# Patient Record
Sex: Male | Born: 1960 | Race: Black or African American | Hispanic: No | Marital: Single | Smoking: Current every day smoker
Health system: Southern US, Community
[De-identification: ages and names within clinical notes are randomized; demographics above are authoritative.]

---

## 1985-07-06 HISTORY — PX: KNEE SURGERY: SHX244

## 2004-07-22 ENCOUNTER — Inpatient Hospital Stay: Payer: Self-pay | Admitting: Internal Medicine

## 2004-09-17 ENCOUNTER — Emergency Department: Payer: Self-pay | Admitting: Emergency Medicine

## 2004-12-12 ENCOUNTER — Inpatient Hospital Stay: Payer: Self-pay | Admitting: Internal Medicine

## 2004-12-12 ENCOUNTER — Other Ambulatory Visit: Payer: Self-pay

## 2005-02-16 ENCOUNTER — Emergency Department: Payer: Self-pay | Admitting: Unknown Physician Specialty

## 2006-01-02 IMAGING — CR DG ABDOMEN 3V
1 series · 6 of 6 positions shown · non-contrast
Comparison: none

REASON FOR EXAM: abd pain  rm 6
COMMENTS:

[Series 1: view not recorded · 0.17mm/px · 6 of 6 slices shown]
[im 1/6]
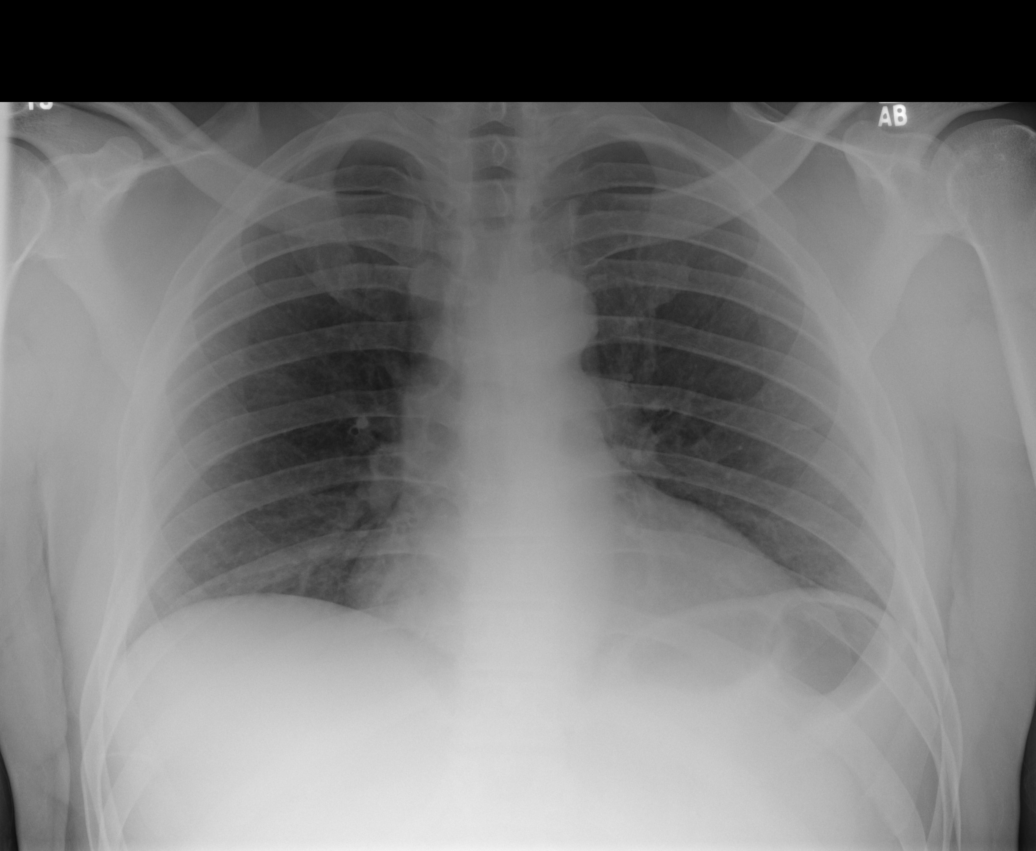
[im 2/6]
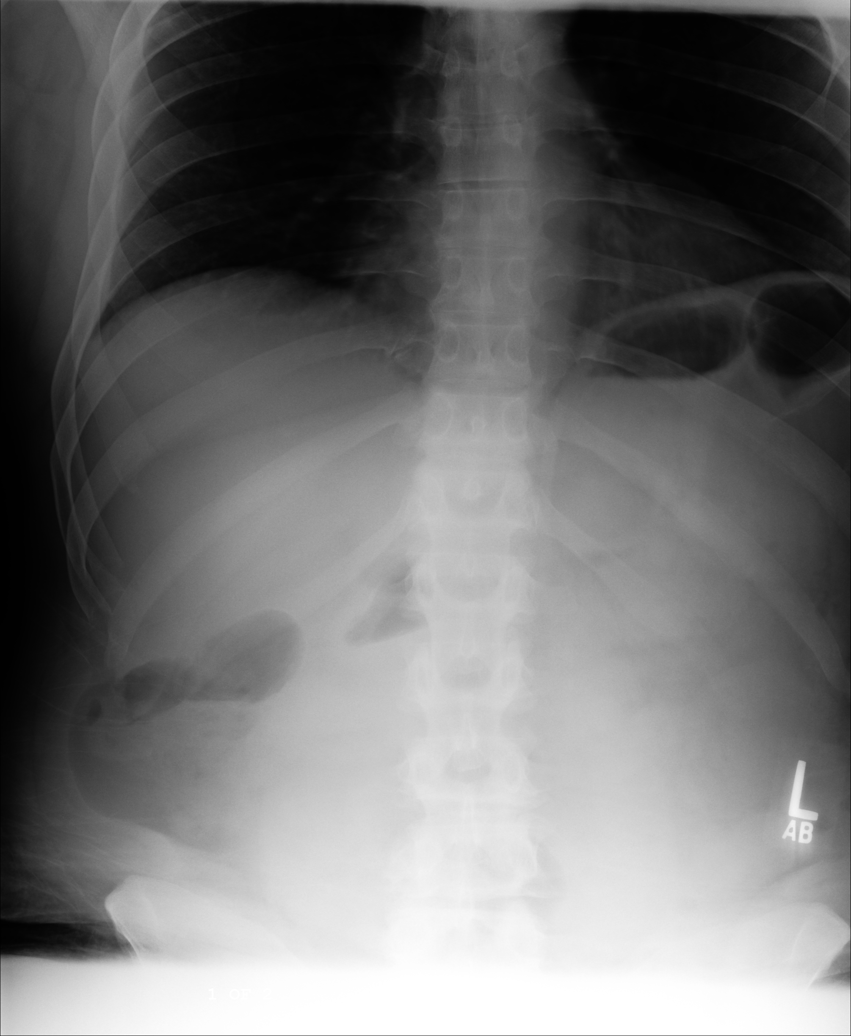
[im 3/6]
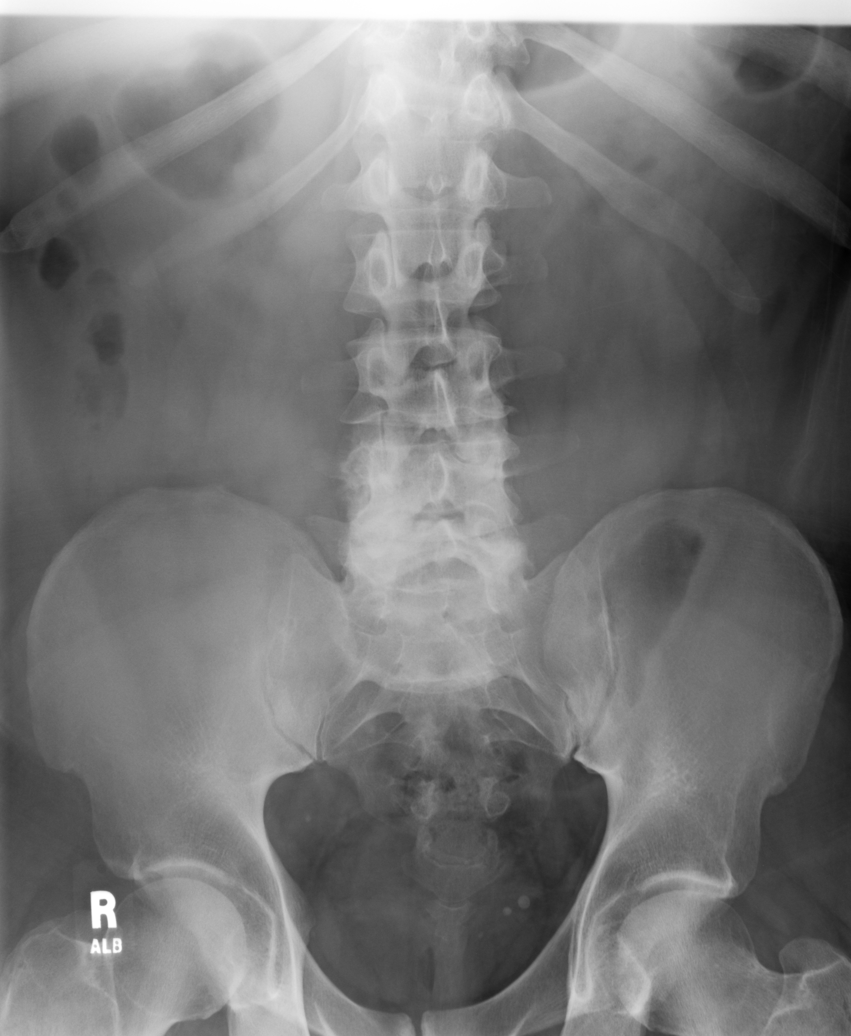
[im 4/6]
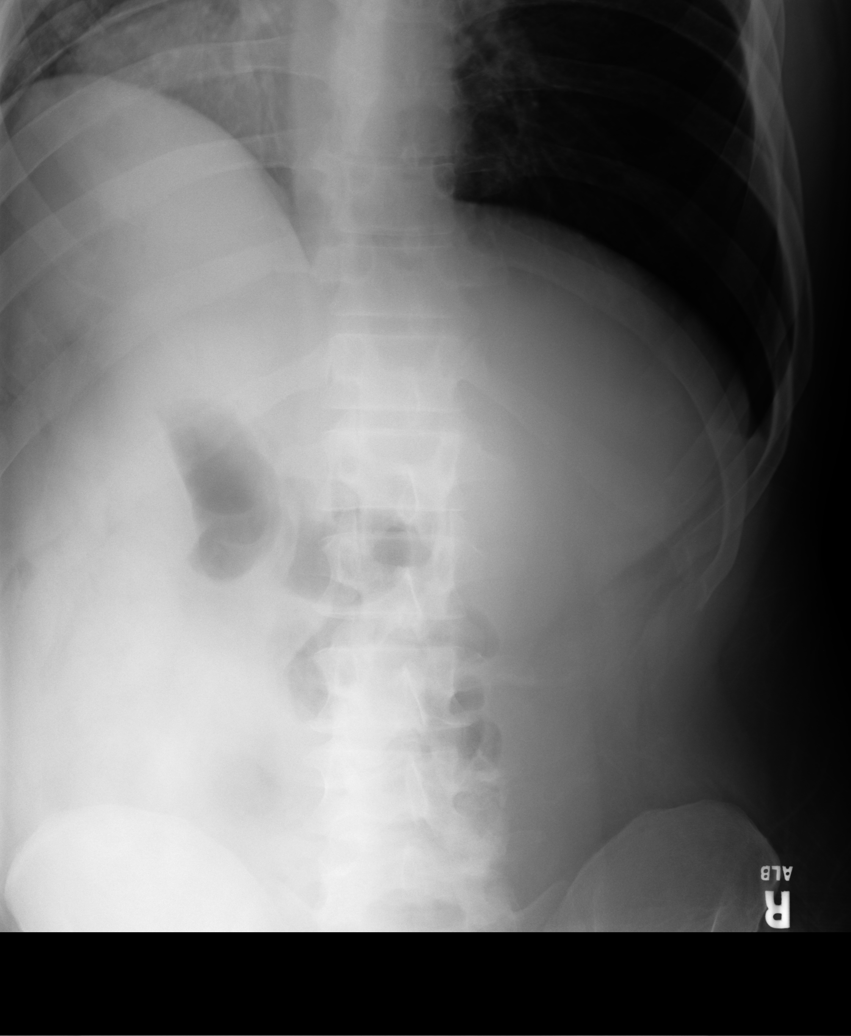
[im 5/6]
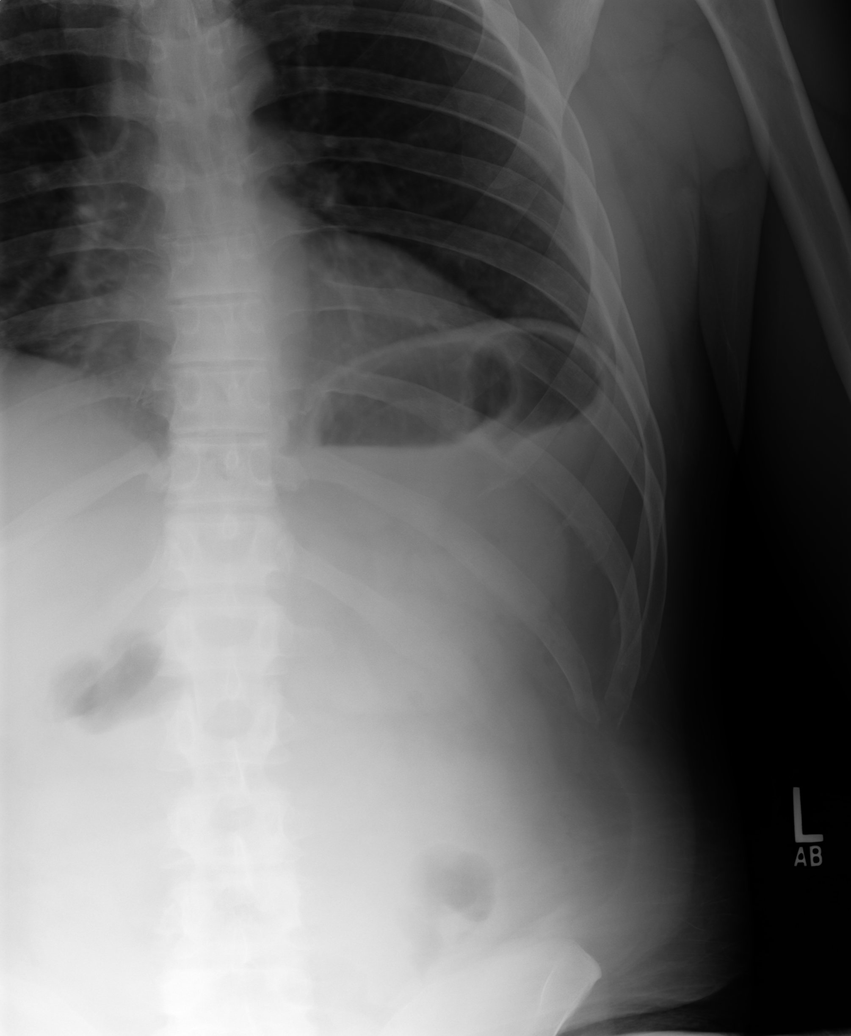
[im 6/6]
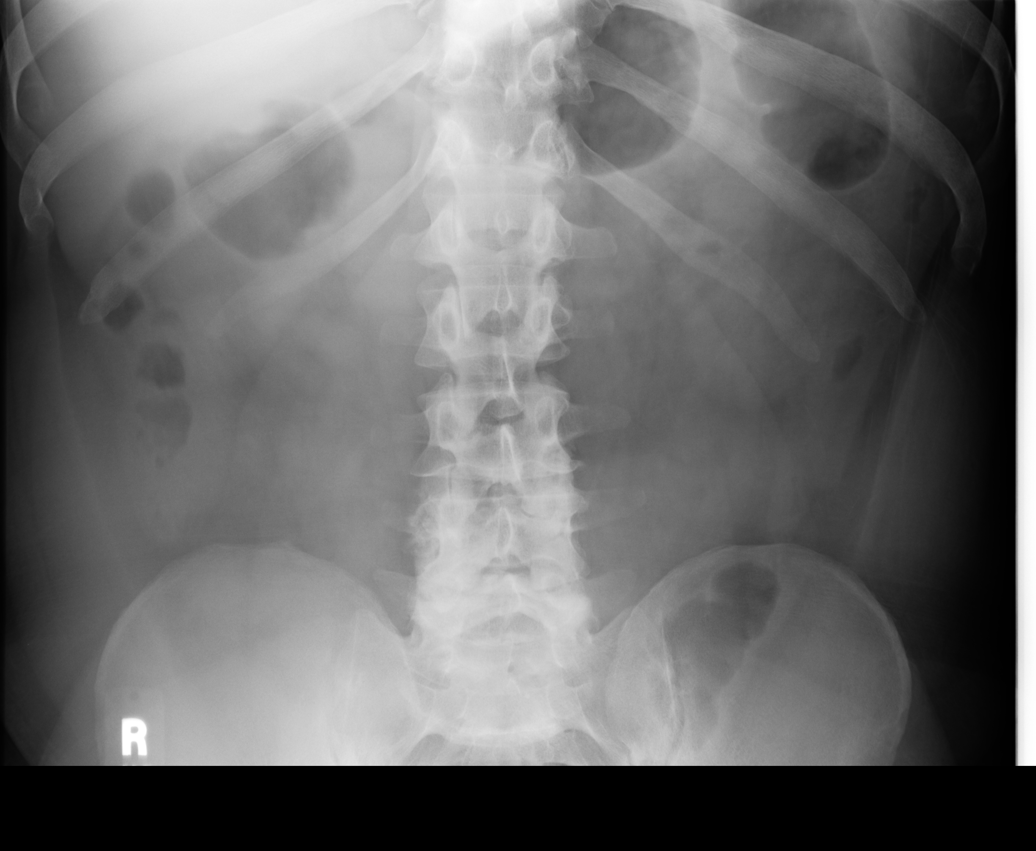

[6 of 6 positions shown; findings below may reference images not displayed]

PROCEDURE:     DXR - DXR ABDOMEN 3-WAY (INCL PA CXR)  - December 12, 2004  [DATE]

RESULT:     No subdiaphragmatic free air is seen. The bowel gas pattern
shows no specific abnormalities.  Gas is visualized in both large and small
bowel.  No evidence of bowel obstruction is seen.  No abnormal
intraabdominal calcifications are noted.  No acute bony abnormalities are
seen.  PA views of the chest show the lung fields to be clear.  The heart is
upper limits of normal in size or mildly enlarged.   No acute bony
abnormalities are seen.
IMPRESSION: 1.     No specific abnormalities are identified.
2.     PA view of the chest shows the lung fields to be clear.

## 2015-04-29 ENCOUNTER — Encounter: Payer: Self-pay | Admitting: *Deleted

## 2015-04-29 ENCOUNTER — Emergency Department
Admission: EM | Admit: 2015-04-29 | Discharge: 2015-04-29 | Disposition: A | Discharge status: Home / Self Care | Payer: No Typology Code available for payment source | Attending: Emergency Medicine | Admitting: Emergency Medicine

## 2015-04-29 DIAGNOSIS — S199XXA Unspecified injury of neck, initial encounter: Secondary | ICD-10-CM | POA: Insufficient documentation

## 2015-04-29 DIAGNOSIS — Y92410 Unspecified street and highway as the place of occurrence of the external cause: Secondary | ICD-10-CM | POA: Insufficient documentation

## 2015-04-29 DIAGNOSIS — Y998 Other external cause status: Secondary | ICD-10-CM | POA: Insufficient documentation

## 2015-04-29 DIAGNOSIS — Y9389 Activity, other specified: Secondary | ICD-10-CM | POA: Diagnosis not present

## 2015-04-29 DIAGNOSIS — S39012A Strain of muscle, fascia and tendon of lower back, initial encounter: Secondary | ICD-10-CM | POA: Insufficient documentation

## 2015-04-29 DIAGNOSIS — S3992XA Unspecified injury of lower back, initial encounter: Secondary | ICD-10-CM | POA: Diagnosis present

## 2015-04-29 DIAGNOSIS — S0990XA Unspecified injury of head, initial encounter: Secondary | ICD-10-CM | POA: Insufficient documentation

## 2015-04-29 DIAGNOSIS — Z720 Tobacco use: Secondary | ICD-10-CM | POA: Diagnosis not present

## 2015-04-29 MED ORDER — ACETAMINOPHEN 500 MG PO TABS
1000.0000 mg | ORAL_TABLET | Freq: Once | ORAL | Status: AC
  Administered 2015-04-29: 1000 mg via ORAL
  Filled 2015-04-29: qty 2

## 2015-04-29 MED ORDER — NAPROXEN 500 MG PO TBEC: 500.0000 mg | DELAYED_RELEASE_TABLET | Freq: Two times a day (BID) | ORAL | Status: DC

## 2015-04-29 MED ORDER — CYCLOBENZAPRINE HCL 5 MG PO TABS: 5.0000 mg | ORAL_TABLET | Freq: Three times a day (TID) | ORAL | Status: DC | PRN
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
## 2017-03-29 NOTE — Progress Notes (Deleted)
03/30/2017 10:25 AM   Patrick Rosario 24-Apr-1961 [id number]  Referring provider: Sherron Monday, MD [address] [place], [place] [address]  No chief complaint on file.   HPI: Patient is a 56 year old African American male who is referred by Dr. Ellsworth Lennox for a penile lesion. Location Severity Quality Context Timing Duration Modifying Factors Associated signs/symptoms            OR Status of 3 chronic or inactive problems   BPH WITH LUTS  (prostate and/or bladder) His IPSS score today is ***, which is *** lower urinary tract symptomatology. He is *** with his quality life due to his urinary symptoms. His PVR is *** mL.  His previous IPSS score was ***.  His previous PVR is *** mL.    His major complaint today ***.  He has had these symptoms for *** years.  He denies any dysuria, hematuria or suprapubic pain.   He currently taking ***.  His has had ***.  Previous PSA's:     He also denies any recent fevers, chills, nausea or vomiting.  He has a family history of PCa, with ***.   He does not have a family history of PCa.***    Score:  1-7 Mild 8-19 Moderate 20-35 Severe   Reviewed referral notes - HSV was negative  PMH: No past medical history on file.  Surgical History: No past surgical history on file.  Home Medications:  Allergies as of 03/30/2017   No Known Allergies     Medication List       Accurate as of 03/29/17 10:25 AM. Always use your most recent med list.          cyclobenzaprine 5 MG tablet Commonly known as:  FLEXERIL Take 1 tablet (5 mg total) by mouth every 8 (eight) hours as needed for muscle spasms.   naproxen 500 MG EC tablet Commonly known as:  EC NAPROSYN Take 1 tablet (500 mg total) by mouth 2 (two) times daily with a meal.       Allergies: No Known Allergies  Family History: No family history on file.  Social History:  reports that he has been smoking.  He does not have any smokeless tobacco  history on file. He reports that he drinks alcohol. He reports that he does not use drugs.  ROS:                                        Physical Exam: There were no vitals taken for this visit.  Constitutional: Well nourished. Alert and oriented, No acute distress. HEENT: [place] AT, moist mucus membranes. Trachea midline, no masses. Cardiovascular: No clubbing, cyanosis, or edema. Respiratory: Normal respiratory effort, no increased work of breathing. GI: Abdomen is soft, non tender, non distended, no abdominal masses. Liver and spleen not palpable.  No hernias appreciated.  Stool sample for occult testing is not indicated.   GU: No CVA tenderness.  No bladder fullness or masses.  Patient with circumcised/uncircumcised phallus. ***Foreskin easily retracted***  Urethral meatus is patent.  No penile discharge. No penile lesions or rashes. Scrotum without lesions, cysts, rashes and/or edema.  Testicles are located scrotally bilaterally. No masses are appreciated in the testicles. Left and right epididymis are normal. Rectal: Patient with  normal sphincter tone. Anus and perineum without scarring or rashes. No rectal masses are appreciated. Prostate is approximately ***  grams, *** nodules are appreciated. Seminal vesicles are normal. Skin: No rashes, bruises or suspicious lesions. Lymph: No cervical or inguinal adenopathy. Neurologic: Grossly intact, no focal deficits, moving all 4 extremities. Psychiatric: Normal mood and affect.  Laboratory Data:  Urinalysis ***  I have reviewed the labs.   Pertinent Imaging: *** I have independently reviewed the films.    Assessment & Plan:  ***  1. Penile lesion  2. BPH with LUTS  - IPSS score is ***, it is stable/improving/worsening  - Continue conservative management, avoiding bladder irritants and timed voiding's  - most bothersome symptoms is/are ***  - Initiate alpha-blocker (***), discussed side effects ***  -  Initiate 5 alpha reductase inhibitor (***), discussed side effects ***  - Continue tamsulosin 0.4 mg daily, alfuzosin 10 mg daily, Rapaflo 8 mg daily, terazosin, doxazosin, Cialis 5 mg daily and finasteride 5 mg daily, dutasteride 0.5 mg daily***:refills given  - Cannot tolerate medication or medication failure, schedule cystoscopy ***  - RTC in *** months for IPSS, PSA, PVR and exam      No Follow-up on file.  These notes generated with voice recognition software. I apologize for typographical errors.  [name], PA-C  [facility] [address] [place], [place] [address] [phone]

## 2017-03-30 ENCOUNTER — Ambulatory Visit: Payer: Self-pay | Admitting: Urology

## 2017-04-05 NOTE — Progress Notes (Deleted)
04/07/2017 9:21 PM   Patrick Rosario [birth year] [id number]  Referring provider: Sherron Monday, MD [address] [place], [place] [address]  No chief complaint on file.   HPI: Patient is a 56 year old African American male who is referred by Dr. Ellsworth Lennox for a penile lesion. Location Severity Quality Context Timing Duration Modifying Factors Associated signs/symptoms            OR Status of 3 chronic or inactive problems   BPH WITH LUTS  (prostate and/or bladder) His IPSS score today is ***, which is *** lower urinary tract symptomatology. He is *** with his quality life due to his urinary symptoms. His PVR is *** mL.  His previous IPSS score was ***.  His previous PVR is *** mL.    His major complaint today ***.  He has had these symptoms for *** years.  He denies any dysuria, hematuria or suprapubic pain.   He currently taking ***.  His has had ***.  Previous PSA's:     He also denies any recent fevers, chills, nausea or vomiting.  He has a family history of PCa, with ***.   He does not have a family history of PCa.***    Score:  1-7 Mild 8-19 Moderate 20-35 Severe   Reviewed referral notes - HSV was negative  PMH: No past medical history on file.  Surgical History: No past surgical history on file.  Home Medications:  Allergies as of 04/07/2017   No Known Allergies     Medication List       Accurate as of 04/05/17  9:21 PM. Always use your most recent med list.          cyclobenzaprine 5 MG tablet Commonly known as:  FLEXERIL Take 1 tablet (5 mg total) by mouth every 8 (eight) hours as needed for muscle spasms.   naproxen 500 MG EC tablet Commonly known as:  EC NAPROSYN Take 1 tablet (500 mg total) by mouth 2 (two) times daily with a meal.       Allergies: No Known Allergies  Family History: No family history on file.  Social History:  reports that he has been smoking.  He does not have any smokeless tobacco history  on file. He reports that he drinks alcohol. He reports that he does not use drugs.  ROS:                                        Physical Exam: There were no vitals taken for this visit.  Constitutional: Well nourished. Alert and oriented, No acute distress. HEENT: [place] AT, moist mucus membranes. Trachea midline, no masses. Cardiovascular: No clubbing, cyanosis, or edema. Respiratory: Normal respiratory effort, no increased work of breathing. GI: Abdomen is soft, non tender, non distended, no abdominal masses. Liver and spleen not palpable.  No hernias appreciated.  Stool sample for occult testing is not indicated.   GU: No CVA tenderness.  No bladder fullness or masses.  Patient with circumcised/uncircumcised phallus. ***Foreskin easily retracted***  Urethral meatus is patent.  No penile discharge. No penile lesions or rashes. Scrotum without lesions, cysts, rashes and/or edema.  Testicles are located scrotally bilaterally. No masses are appreciated in the testicles. Left and right epididymis are normal. Rectal: Patient with  normal sphincter tone. Anus and perineum without scarring or rashes. No rectal masses are appreciated. Prostate is approximately ***  grams, *** nodules are appreciated. Seminal vesicles are normal. Skin: No rashes, bruises or suspicious lesions. Lymph: No cervical or inguinal adenopathy. Neurologic: Grossly intact, no focal deficits, moving all 4 extremities. Psychiatric: Normal mood and affect.  Laboratory Data:  Urinalysis ***  I have reviewed the labs.   Pertinent Imaging: *** I have independently reviewed the films.    Assessment & Plan:  ***  1. Penile lesion  2. BPH with LUTS  - IPSS score is ***, it is stable/improving/worsening  - Continue conservative management, avoiding bladder irritants and timed voiding's  - most bothersome symptoms is/are ***  - Initiate alpha-blocker (***), discussed side effects ***  - Initiate 5  alpha reductase inhibitor (***), discussed side effects ***  - Continue tamsulosin 0.4 mg daily, alfuzosin 10 mg daily, Rapaflo 8 mg daily, terazosin, doxazosin, Cialis 5 mg daily and finasteride 5 mg daily, dutasteride 0.5 mg daily***:refills given  - Cannot tolerate medication or medication failure, schedule cystoscopy ***  - RTC in *** months for IPSS, PSA, PVR and exam      No Follow-up on file.  These notes generated with voice recognition software. I apologize for typographical errors.  [name], PA-C  [facility] [address], [address] [place], [place] [address] [phone]

## 2017-04-07 ENCOUNTER — Ambulatory Visit: Payer: Self-pay | Admitting: Urology

## 2017-04-12 NOTE — Progress Notes (Deleted)
04/13/2017 10:32 PM   Patrick Rosario September 01, 1960 [id number]  Referring provider: Sherron Monday, MD [address] [place], [place] [address]  No chief complaint on file.   HPI: Patient is a 56 year old African American male who is referred by Dr. Ellsworth Lennox for a penile lesion. Location Severity Quality Context Timing Duration Modifying Factors Associated signs/symptoms            OR Status of 3 chronic or inactive problems   BPH WITH LUTS  (prostate and/or bladder) His IPSS score today is ***, which is *** lower urinary tract symptomatology. He is *** with his quality life due to his urinary symptoms. His PVR is *** mL.  His previous IPSS score was ***.  His previous PVR is *** mL.    His major complaint today ***.  He has had these symptoms for *** years.  He denies any dysuria, hematuria or suprapubic pain.   He currently taking ***.  His has had ***.  Previous PSA's:     He also denies any recent fevers, chills, nausea or vomiting.  He has a family history of PCa, with ***.   He does not have a family history of PCa.***    Score:  1-7 Mild 8-19 Moderate 20-35 Severe   Reviewed referral notes - HSV was negative  PMH: No past medical history on file.  Surgical History: No past surgical history on file.  Home Medications:  Allergies as of 04/13/2017   No Known Allergies     Medication List       Accurate as of 04/12/17 10:32 PM. Always use your most recent med list.          cyclobenzaprine 5 MG tablet Commonly known as:  FLEXERIL Take 1 tablet (5 mg total) by mouth every 8 (eight) hours as needed for muscle spasms.   naproxen 500 MG EC tablet Commonly known as:  EC NAPROSYN Take 1 tablet (500 mg total) by mouth 2 (two) times daily with a meal.       Allergies: No Known Allergies  Family History: No family history on file.  Social History:  reports that he has been smoking.  He does not have any smokeless tobacco  history on file. He reports that he drinks alcohol. He reports that he does not use drugs.  ROS:                                        Physical Exam: There were no vitals taken for this visit.  Constitutional: Well nourished. Alert and oriented, No acute distress. HEENT: [place] AT, moist mucus membranes. Trachea midline, no masses. Cardiovascular: No clubbing, cyanosis, or edema. Respiratory: Normal respiratory effort, no increased work of breathing. GI: Abdomen is soft, non tender, non distended, no abdominal masses. Liver and spleen not palpable.  No hernias appreciated.  Stool sample for occult testing is not indicated.   GU: No CVA tenderness.  No bladder fullness or masses.  Patient with circumcised/uncircumcised phallus. ***Foreskin easily retracted***  Urethral meatus is patent.  No penile discharge. No penile lesions or rashes. Scrotum without lesions, cysts, rashes and/or edema.  Testicles are located scrotally bilaterally. No masses are appreciated in the testicles. Left and right epididymis are normal. Rectal: Patient with  normal sphincter tone. Anus and perineum without scarring or rashes. No rectal masses are appreciated. Prostate is approximately ***  grams, *** nodules are appreciated. Seminal vesicles are normal. Skin: No rashes, bruises or suspicious lesions. Lymph: No cervical or inguinal adenopathy. Neurologic: Grossly intact, no focal deficits, moving all 4 extremities. Psychiatric: Normal mood and affect.  Laboratory Data:  Urinalysis ***  I have reviewed the labs.   Pertinent Imaging: *** I have independently reviewed the films.    Assessment & Plan:  ***  1. Penile lesion  2. BPH with LUTS  - IPSS score is ***, it is stable/improving/worsening  - Continue conservative management, avoiding bladder irritants and timed voiding's  - most bothersome symptoms is/are ***  - Initiate alpha-blocker (***), discussed side effects ***  -  Initiate 5 alpha reductase inhibitor (***), discussed side effects ***  - Continue tamsulosin 0.4 mg daily, alfuzosin 10 mg daily, Rapaflo 8 mg daily, terazosin, doxazosin, Cialis 5 mg daily and finasteride 5 mg daily, dutasteride 0.5 mg daily***:refills given  - Cannot tolerate medication or medication failure, schedule cystoscopy ***  - RTC in *** months for IPSS, PSA, PVR and exam      No Follow-up on file.  These notes generated with voice recognition software. I apologize for typographical errors.  [name], PA-C  [facility] [address] [place], [place] [address] [phone]

## 2017-04-13 ENCOUNTER — Ambulatory Visit: Payer: Self-pay | Admitting: Urology

## 2017-04-13 ENCOUNTER — Encounter: Payer: Self-pay | Admitting: Urology

## 2017-04-19 NOTE — Progress Notes (Signed)
04/20/2017 10:26 AM   Patrick Rosario 1960/11/28 [id number]  Referring provider: Sherron Monday, MD [address] [place], [place] [address]  Chief Complaint  Patient presents with  . New Patient (Initial Visit)    Penile Lesion referred by Dr. Ellsworth Lennox    HPI: Patient is a 56 year old African American male who is referred by Dr. Ellsworth Lennox for a penile lesion.  He states the lesion has been present for about one month.  He is sexually active with one partner at this time.  He has not noticed any changes to the lesion since it was first discovered.  He did not injure his penis.  He placed hydrogen peroxide to the lesion.  It did nothing to the lesion.    He has noticed that his foreskin swells sometimes after sexual activity.  This started to happen after his girl pulled down too hard on it.    BPH WITH LUTS  (prostate and/or bladder) His IPSS score today is 6, which is mild lower urinary tract symptomatology. He is mostly satisfied with his quality life due to his urinary symptoms.     His major complaint today is nocturia x 2.  He has had these symptoms for several years.  He attributes this to drinking a lot of fluids.  He denies any dysuria, hematuria or suprapubic pain.   He also denies any recent fevers, chills, nausea or vomiting.  He does not have a family history of PCa.      IPSS    Row Name 04/20/17 0900         International Prostate Symptom Score   How often have you had the sensation of not emptying your bladder? Not at All     How often have you had to urinate less than every two hours? Less than half the time     How often have you found you stopped and started again several times when you urinated? Not at All     How often have you found it difficult to postpone urination? Less than half the time     How often have you had a weak urinary stream? Not at All     How often have you had to strain to start urination? Not at All     How many  times did you typically get up at night to urinate? 2 Times     Total IPSS Score 6       Quality of Life due to urinary symptoms   If you were to spend the rest of your life with your urinary condition just the way it is now how would you feel about that? Mostly Satisfied        Score:  1-7 Mild 8-19 Moderate 20-35 Severe   Reviewed referral notes - HSV was negative  PMH: No past medical history on file.  Surgical History: Past Surgical History:  Procedure Laterality Date  . KNEE SURGERY Left 1987    Home Medications:  Allergies as of 04/20/2017   No Known Allergies     Medication List       Accurate as of 04/20/17 10:26 AM. Always use your most recent med list.          acyclovir 200 MG capsule Commonly known as:  ZOVIRAX Take 200 mg by mouth 5 (five) times daily.   cyclobenzaprine 5 MG tablet Commonly known as:  FLEXERIL Take 1 tablet (5 mg total) by mouth every 8 (eight)  hours as needed for muscle spasms.   naproxen 500 MG EC tablet Commonly known as:  EC NAPROSYN Take 1 tablet (500 mg total) by mouth 2 (two) times daily with a meal.   triamcinolone 0.025 % ointment Commonly known as:  KENALOG Apply 1 application topically 2 (two) times daily.       Allergies: No Known Allergies  Family History: Family History  Problem Relation Age of Onset  . Prostate cancer Neg Hx   . Kidney cancer Neg Hx   . Bladder Cancer Neg Hx     Social History:  reports that he has been smoking.  He has never used smokeless tobacco. He reports that he drinks alcohol. He reports that he does not use drugs.  ROS: UROLOGY Frequent Urination?: No Hard to postpone urination?: No Burning/pain with urination?: No Get up at night to urinate?: Yes Leakage of urine?: No Urine stream starts and stops?: No Trouble starting stream?: No Do you have to strain to urinate?: No Blood in urine?: No Urinary tract infection?: No Sexually transmitted disease?: No Injury to  kidneys or bladder?: No Painful intercourse?: No Weak stream?: No Erection problems?: No Penile pain?: No  Gastrointestinal Nausea?: No Vomiting?: No Indigestion/heartburn?: No Diarrhea?: No Constipation?: No  Constitutional Fever: No Night sweats?: No Weight loss?: No Fatigue?: No  Skin Skin rash/lesions?: No Itching?: No  Eyes Blurred vision?: No Double vision?: No  Ears/Nose/Throat Sore throat?: No Sinus problems?: No  Hematologic/Lymphatic Swollen glands?: No Easy bruising?: No  Cardiovascular Leg swelling?: No Chest pain?: No  Respiratory Cough?: No Shortness of breath?: No  Endocrine Excessive thirst?: No  Musculoskeletal Back pain?: No Joint pain?: No  Neurological Headaches?: No Dizziness?: No  Psychologic Depression?: No Anxiety?: No  Physical Exam: BP (!) 149/89   Pulse 60   Ht  (1.88 m)   Wt 233 lb (105.7 kg)   BMI 29.92 kg/m   Constitutional: Well nourished. Alert and oriented, No acute distress. HEENT: [place] AT, moist mucus membranes. Trachea midline, no masses. Cardiovascular: No clubbing, cyanosis, or edema. Respiratory: Normal respiratory effort, no increased work of breathing. GI: Abdomen is soft, non tender, non distended, no abdominal masses. Liver and spleen not palpable.  No hernias appreciated.  Stool sample for occult testing is not indicated.   GU: No CVA tenderness.  No bladder fullness or masses.  Patient with uncircumcised phallus. Foreskin easily retracted   Urethral meatus is patent.  No penile discharge. No penile lesions or rashes.  Vitiligo presents on the glans.  Frenulum breve is present.  Scrotum without lesions, cysts, rashes and/or edema.  Testicles are located scrotally bilaterally. No masses are appreciated in the testicles. Left and right epididymis are normal. Rectal: Patient states his PCP checks his prostate yearly.   Skin: No rashes, bruises or suspicious lesions. Lymph: No cervical or inguinal  adenopathy. Neurologic: Grossly intact, no focal deficits, moving all 4 extremities. Psychiatric: Normal mood and affect.  Assessment & Plan:    1. Penile lesion  - Patient reassured that the "lesions" were to like which is a benign condition   2. Frenulum breve  - Offered circumcision the patient declined  - Prescribed Kenalog cream twice a day to see if it would provide some relief  - RTC if worsening symptoms  3. BPH with LUTS  - IPSS score is 6/2  - Continue conservative management, avoiding bladder irritants and timed voiding's  - most bothersome symptoms is/are nocturia  - RTC pending PSA  Return if symptoms worsen or fail to improve.  These notes generated with voice recognition software. I apologize for typographical errors.  [name], PA-C  [facility] [address] [place], [place] [address] [phone]

## 2017-04-20 ENCOUNTER — Telehealth: Payer: Self-pay | Admitting: Urology

## 2017-04-20 ENCOUNTER — Encounter: Payer: Self-pay | Admitting: Urology

## 2017-04-20 ENCOUNTER — Ambulatory Visit (INDEPENDENT_AMBULATORY_CARE_PROVIDER_SITE_OTHER): Payer: BLUE CROSS/BLUE SHIELD | Admitting: Urology

## 2017-04-20 VITALS — BP 149/89 | HR 60 | Ht 74.0 in | Wt 233.0 lb

## 2017-04-20 DIAGNOSIS — N401 Enlarged prostate with lower urinary tract symptoms: Secondary | ICD-10-CM | POA: Diagnosis not present

## 2017-04-20 DIAGNOSIS — N138 Other obstructive and reflux uropathy: Secondary | ICD-10-CM

## 2017-04-20 DIAGNOSIS — Q5569 Other congenital malformation of penis: Secondary | ICD-10-CM

## 2017-04-20 DIAGNOSIS — L80 Vitiligo: Secondary | ICD-10-CM

## 2017-04-20 MED ORDER — TRIAMCINOLONE ACETONIDE 0.025 % EX OINT: 1.0000 "application " | TOPICAL_OINTMENT | Freq: Two times a day (BID) | CUTANEOUS | 0 refills | Status: DC

## 2017-04-20 NOTE — Telephone Encounter (Signed)
Notes faxed  ° ° °michelle °

## 2017-04-20 NOTE — Telephone Encounter (Signed)
Would you send my note to Dr. Ellsworth Lennox?

## 2017-04-21 ENCOUNTER — Telehealth: Payer: Self-pay

## 2017-04-21 LAB — PSA: Prostate Specific Ag, Serum: 0.6 ng/mL (ref 0.0–4.0)

## 2017-04-21 NOTE — Telephone Encounter (Signed)
Spoke with pt in reference to PSA results. Pt voiced understanding.  

## 2017-04-21 NOTE — Telephone Encounter (Signed)
-----   Message from Harle BattiestShannon A McGowan, PA-C sent at 04/21/2017  7:48 AM EDT ----- Please let Mr. Patrick Rosario know that his PSA is normal.

## 2020-11-20 NOTE — Progress Notes (Addendum)
11/21/2020 9:47 AM   Patrick Rosario [birth year] [id number]  Referring provider: Sherron Monday, MD [address] [place],  [place] [address]  Chief Complaint  Patient presents with  . Penis Pain   Urological history: 1. BPH with LU TS -PSA pending -I PSS 5/2  HPI: Patrick Rosario is a 60 y.o. male who presents today for a penile skin issues.    His state the area of concern on his penis has increased in size.  It is not painful or irritating.    Patient denies any modifying or aggravating factors.  Patient denies any gross hematuria, dysuria or suprapubic/flank pain.  Patient denies any fevers, chills, nausea or vomiting.      IPSS    Row Name 11/21/20 0900         International Prostate Symptom Score   How often have you had the sensation of not emptying your bladder? Not at All     How often have you had to urinate less than every two hours? Less than half the time     How often have you found you stopped and started again several times when you urinated? Less than 1 in 5 times     How often have you found it difficult to postpone urination? Not at All     How often have you had a weak urinary stream? Not at All     How often have you had to strain to start urination? Not at All     How many times did you typically get up at night to urinate? 2 Times     Total IPSS Score 5           Quality of Life due to urinary symptoms   If you were to spend the rest of your life with your urinary condition just the way it is now how would you feel about that? Mostly Satisfied            Score:  1-7 Mild 8-19 Moderate 20-35 Severe  Patient still having spontaneous erections.   He denies any pain or curvature with erections.     SHIM    Row Name 11/21/20 0918         SHIM: Over the last 6 months:   How do you rate your confidence that you could get and keep an erection? High     When you had erections with sexual stimulation, how often were your  erections hard enough for penetration (entering your partner)? Almost Always or Always     During sexual intercourse, how often were you able to maintain your erection after you had penetrated (entered) your partner? Almost Always or Always     During sexual intercourse, how difficult was it to maintain your erection to completion of intercourse? Not Difficult     When you attempted sexual intercourse, how often was it satisfactory for you? Almost Always or Always           SHIM Total Score   SHIM 24            Score: 1-7 Severe ED 8-11 Moderate ED 12-16 Mild-Moderate ED 17-21 Mild ED 22-25 No ED  PMH: No past medical history on file.  Surgical History: Past Surgical History:  Procedure Laterality Date  . KNEE SURGERY Left 1987    Home Medications:  Allergies as of 11/21/2020   No Known Allergies     Medication List  Accurate as of Nov 21, 2020  9:47 AM. If you have any questions, ask your nurse or doctor.        STOP taking these medications   acyclovir 200 MG capsule Commonly known as: ZOVIRAX Stopped by: Kaianna Dolezal, PA-C   cyclobenzaprine 5 MG tablet Commonly known as: Flexeril Stopped by: Jenalyn Girdner, PA-C   naproxen 500 MG EC tablet Commonly known as: EC NAPROSYN Stopped by: Shekina Cordell, PA-C   triamcinolone 0.025 % ointment Commonly known as: KENALOG Stopped by: Latonya Nelon, PA-C     TAKE these medications   meloxicam 15 MG tablet Commonly known as: MOBIC Take 1 tablet by mouth daily.       Allergies: No Known Allergies  Family History: Family History  Problem Relation Age of Onset  . Prostate cancer Neg Hx   . Kidney cancer Neg Hx   . Bladder Cancer Neg Hx     Social History:  reports that he has been smoking. He has never used smokeless tobacco. He reports current alcohol use. He reports that he does not use drugs.  For pertinent review of systems please refer to history of present illness  Physical  Exam: BP 134/82   Pulse (!) 52   Ht 6\' 2"  (1.88 m)   Wt 220 lb (99.8 kg)   BMI 28.25 kg/m   Constitutional:  Well nourished. Alert and oriented, No acute distress. HEENT: [place] AT, mask in place.  Trachea midline Cardiovascular: No clubbing, cyanosis, or edema. Respiratory: Normal respiratory effort, no increased work of breathing. GU: No CVA tenderness.  No bladder fullness or masses.  Patient with uncircumcised phallus.  Foreskin easily retracted.  Frenulum breve is present.  Urethral meatus is patent.  No penile discharge. No penile lesions or rashes. A circular area of no pigment is seen around the meatus.  Scrotum without lesions, cysts, rashes and/or edema.  Testicles are located scrotally bilaterally. No masses are appreciated in the testicles. Left and right epididymis are normal. Rectal: Refused exam stating PCP checks his prostate yearly. Skin: No rashes, bruises or suspicious lesions.   Lymph: No inguinal adenopathy. Neurologic: Grossly intact, no focal deficits, moving all 4 extremities. Psychiatric: Normal mood and affect.  Assessment & Plan:    1. Vitiligo -area of clearing is seen on the glans -differentials of vitiligo, balanitis and/or BXO  -will refer to dermatology for further evaluation and treatment as patient is not interested in circumcision at this time   2. Frenulum breve -not bothersome -not interested in a circumcision   3. BPH with LU TS -PSA pending -no bothersome symptoms   Return for refer to dermatology .  These notes generated with voice recognition software. I apologize for typographical errors.  , PA-C  [facility] [address] [place], [place] [phone]

## 2020-11-21 ENCOUNTER — Ambulatory Visit (INDEPENDENT_AMBULATORY_CARE_PROVIDER_SITE_OTHER): Payer: BLUE CROSS/BLUE SHIELD | Admitting: Urology

## 2020-11-21 ENCOUNTER — Other Ambulatory Visit: Payer: Self-pay

## 2020-11-21 ENCOUNTER — Encounter: Payer: Self-pay | Admitting: Urology

## 2020-11-21 VITALS — BP 134/82 | HR 52 | Ht 74.0 in | Wt 220.0 lb

## 2020-11-21 DIAGNOSIS — L80 Vitiligo: Secondary | ICD-10-CM | POA: Diagnosis not present

## 2020-11-21 DIAGNOSIS — N138 Other obstructive and reflux uropathy: Secondary | ICD-10-CM

## 2020-11-21 DIAGNOSIS — N401 Enlarged prostate with lower urinary tract symptoms: Secondary | ICD-10-CM

## 2020-11-22 LAB — PSA: Prostate Specific Ag, Serum: 0.6 ng/mL (ref 0.0–4.0)

## 2022-07-20 NOTE — Progress Notes (Deleted)
    07/21/2022 11:10 AM   Chipper Herb October 19, 1960 [id number]  Referring provider: Jodi Marble, MD [address] [place],  [place] [address]  No chief complaint on file.  Urological history: 1. BPH with LU TS -PSA pending -I PSS ***  HPI: Patrick Rosario is a 62 y.o. male who presents today for a penile swelling and discomfort.         Score:  1-7 Mild 8-19 Moderate 20-35 Severe       Score: 1-7 Severe ED 8-11 Moderate ED 12-16 Mild-Moderate ED 17-21 Mild ED 22-25 No ED  PMH: No past medical history on file.  Surgical History: Past Surgical History:  Procedure Laterality Date   KNEE SURGERY Left 1987    Home Medications:  Allergies as of 07/21/2022   No Known Allergies      Medication List        Accurate as of July 20, 2022 11:10 AM. If you have any questions, ask your nurse or doctor.          meloxicam 15 MG tablet Commonly known as: MOBIC Take 1 tablet by mouth daily.        Allergies: No Known Allergies  Family History: Family History  Problem Relation Age of Onset   Prostate cancer Neg Hx    Kidney cancer Neg Hx    Bladder Cancer Neg Hx     Social History:  reports that he has been smoking. He has never used smokeless tobacco. He reports current alcohol use. He reports that he does not use drugs.  For pertinent review of systems please refer to history of present illness  Physical Exam: There were no vitals taken for this visit.  Constitutional:  Well nourished. Alert and oriented, No acute distress. HEENT: [place] AT, moist mucus membranes.  Trachea midline Cardiovascular: No clubbing, cyanosis, or edema. Respiratory: Normal respiratory effort, no increased work of breathing. GU: No CVA tenderness.  No bladder fullness or masses.  Patient with circumcised/uncircumcised phallus. ***Foreskin easily retracted***  Urethral meatus is patent.  No penile discharge. No penile lesions or rashes. Scrotum  without lesions, cysts, rashes and/or edema.  Testicles are located scrotally bilaterally. No masses are appreciated in the testicles. Left and right epididymis are normal. Rectal: Patient with  normal sphincter tone. Anus and perineum without scarring or rashes. No rectal masses are appreciated. Prostate is approximately *** grams, *** nodules are appreciated. Seminal vesicles are normal. Neurologic: Grossly intact, no focal deficits, moving all 4 extremities. Psychiatric: Normal mood and affect.   Assessment & Plan:    1. Vitiligo -area of clearing is seen on the glans -differentials of vitiligo, balanitis and/or BXO  -will refer to dermatology for further evaluation and treatment as patient is not interested in circumcision at this time   2. Frenulum breve -not bothersome -not interested in a circumcision   3. BPH with LU TS -PSA pending -no bothersome symptoms   No follow-ups on file.  These notes generated with voice recognition software. I apologize for typographical errors.  Micheala Morissette, [place] [address] [place],  [address] [phone]

## 2022-07-21 ENCOUNTER — Encounter: Payer: Self-pay | Admitting: Urology

## 2022-07-21 ENCOUNTER — Ambulatory Visit: Payer: BLUE CROSS/BLUE SHIELD | Admitting: Urology

## 2022-07-21 DIAGNOSIS — I739 Peripheral vascular disease, unspecified: Secondary | ICD-10-CM | POA: Diagnosis not present

## 2022-07-21 DIAGNOSIS — R634 Abnormal weight loss: Secondary | ICD-10-CM | POA: Diagnosis not present

## 2022-07-21 DIAGNOSIS — L80 Vitiligo: Secondary | ICD-10-CM

## 2022-07-21 DIAGNOSIS — N138 Other obstructive and reflux uropathy: Secondary | ICD-10-CM

## 2022-07-21 DIAGNOSIS — M542 Cervicalgia: Secondary | ICD-10-CM | POA: Diagnosis not present

## 2022-07-21 DIAGNOSIS — K922 Gastrointestinal hemorrhage, unspecified: Secondary | ICD-10-CM | POA: Diagnosis not present

## 2022-07-21 DIAGNOSIS — Z0001 Encounter for general adult medical examination with abnormal findings: Secondary | ICD-10-CM | POA: Diagnosis not present

## 2022-07-21 DIAGNOSIS — J449 Chronic obstructive pulmonary disease, unspecified: Secondary | ICD-10-CM | POA: Diagnosis not present

## 2022-07-21 DIAGNOSIS — R69 Illness, unspecified: Secondary | ICD-10-CM | POA: Diagnosis not present

## 2022-07-21 DIAGNOSIS — M7550 Bursitis of unspecified shoulder: Secondary | ICD-10-CM | POA: Diagnosis not present

## 2022-07-21 DIAGNOSIS — J309 Allergic rhinitis, unspecified: Secondary | ICD-10-CM | POA: Diagnosis not present

## 2022-07-22 DIAGNOSIS — Z0001 Encounter for general adult medical examination with abnormal findings: Secondary | ICD-10-CM | POA: Diagnosis not present

## 2022-08-17 ENCOUNTER — Ambulatory Visit: Payer: Self-pay | Admitting: Internal Medicine

## 2022-09-23 DIAGNOSIS — M7551 Bursitis of right shoulder: Secondary | ICD-10-CM | POA: Diagnosis not present
